# Patient Record
Sex: Female | Born: 1960 | Hispanic: Yes | Marital: Single
Health system: Southern US, Community
[De-identification: ages and names within clinical notes are randomized; demographics above are authoritative.]

---

## 2012-04-06 HISTORY — PX: BREAST BIOPSY: SHX20

## 2016-07-22 ENCOUNTER — Ambulatory Visit
Admission: RE | Admit: 2016-07-22 | Discharge: 2016-07-22 | Disposition: A | Discharge status: Home / Self Care | Payer: Self-pay | Source: Ambulatory Visit | Attending: Oncology | Admitting: Oncology

## 2016-07-22 ENCOUNTER — Ambulatory Visit: Payer: Self-pay | Attending: Oncology

## 2016-07-22 VITALS — BP 154/97 | HR 57 | Temp 96.7°F | Resp 18 | Ht 61.0 in | Wt 135.5 lb

## 2016-07-22 DIAGNOSIS — Z0000 Encounter for general adult medical examination without abnormal findings: Secondary | ICD-10-CM

## 2016-07-22 NOTE — Progress Notes (Signed)
Subjective:     Patient ID: Monica Ayala, female   DOB: 1960/04/22, 56 y.o.   MRN: [id number]  HPI   Review of Systems     Objective:   Physical Exam  Pulmonary/Chest: Right breast exhibits no inverted nipple, no mass, no nipple discharge, no skin change and no tenderness. Left breast exhibits no inverted nipple, no mass, no nipple discharge, no skin change and no tenderness. Breasts are symmetrical.       Assessment:     56 year old patient presents for [facility] clinic visit.  Patient screened, and meets B[facility]CP eligibility.  Patient does not have insurance, Medicare or Medicaid.  Handout given on Affordable Care Act.  Instructed patient on breast self-exam using teach back method.  CBE unremarkable.  Patient states she had an emergency hysterectomy at [facility] in 2007 when her child was born.  Pelvic exam reveals no cervix.  Jaqui Laukaitis interpreted exam.    Plan:     Instructed patient on breast self-exam using teach back method.

## 2016-07-28 ENCOUNTER — Inpatient Hospital Stay
Admission: RE | Admit: 2016-07-28 | Discharge: 2016-07-28 | Disposition: A | Discharge status: Home / Self Care | Payer: Self-pay | Source: Ambulatory Visit | Attending: *Deleted | Admitting: *Deleted

## 2016-07-28 ENCOUNTER — Other Ambulatory Visit: Payer: Self-pay | Admitting: *Deleted

## 2016-07-28 DIAGNOSIS — Z9289 Personal history of other medical treatment: Secondary | ICD-10-CM

## 2016-07-29 NOTE — Progress Notes (Signed)
Letter mailed from [facility] to notify of normal mammogram results.  Patient to return in one year for annual screening.  Copy to [facility]. 

## 2017-09-01 ENCOUNTER — Ambulatory Visit: Payer: Self-pay | Attending: Oncology | Admitting: *Deleted

## 2017-09-01 ENCOUNTER — Ambulatory Visit
Admission: RE | Admit: 2017-09-01 | Discharge: 2017-09-01 | Disposition: A | Discharge status: Home / Self Care | Payer: Self-pay | Source: Ambulatory Visit | Attending: Oncology | Admitting: Oncology

## 2017-09-01 VITALS — BP 142/84 | HR 59 | Temp 98.1°F

## 2017-09-01 DIAGNOSIS — Z0000 Encounter for general adult medical examination without abnormal findings: Secondary | ICD-10-CM

## 2017-09-01 NOTE — Progress Notes (Signed)
  Subjective:     Patient ID: Monica Ayala, female   DOB: April 30, 1960, 57 y.o.   MRN: [id number]  HPI   Review of Systems     Objective:   Physical Exam  Pulmonary/Chest: Right breast exhibits no inverted nipple, no mass, no nipple discharge, no skin change and no tenderness. Left breast exhibits no inverted nipple, no mass, no nipple discharge, no skin change and no tenderness.       Assessment:     57 year old Hispanic female returns to [facility] for annual screening.  Orson Slick, the interpreter present during the interview and exam.  Clinical breast exam unremarkable.  Taught self breast awareness. Patient has been screened for eligibility.  She does not have any insurance, Medicare or Medicaid.  She also meets financial eligibility.  Hand-out given on the Affordable Care Act.    Plan:     Screening mammogram ordered.  Will follow-up per [facility] protocol.

## 2017-09-01 NOTE — Patient Instructions (Signed)
Gave patient hand-out, Women Staying Healthy, Active and Well from [facility], with education on breast health, pap smears, heart and colon health. 

## 2017-10-01 ENCOUNTER — Encounter: Payer: Self-pay | Admitting: *Deleted

## 2017-10-01 NOTE — Progress Notes (Signed)
Letter mailed from [facility] to inform patient of her normal mammogram results.  Patient is to follow-up with annual screening in one year.  [facility] to [facility]y. 

## 2019-06-22 IMAGING — MG MM DIGITAL SCREENING BILAT W/ TOMO W/ CAD
8 series · 8 of 24 positions shown · non-contrast
Comparison: Previous exam(s).

CLINICAL DATA: Screening.

EXAM:
DIGITAL SCREENING BILATERAL MAMMOGRAM WITH TOMO AND CAD

[L CC synth-2D]
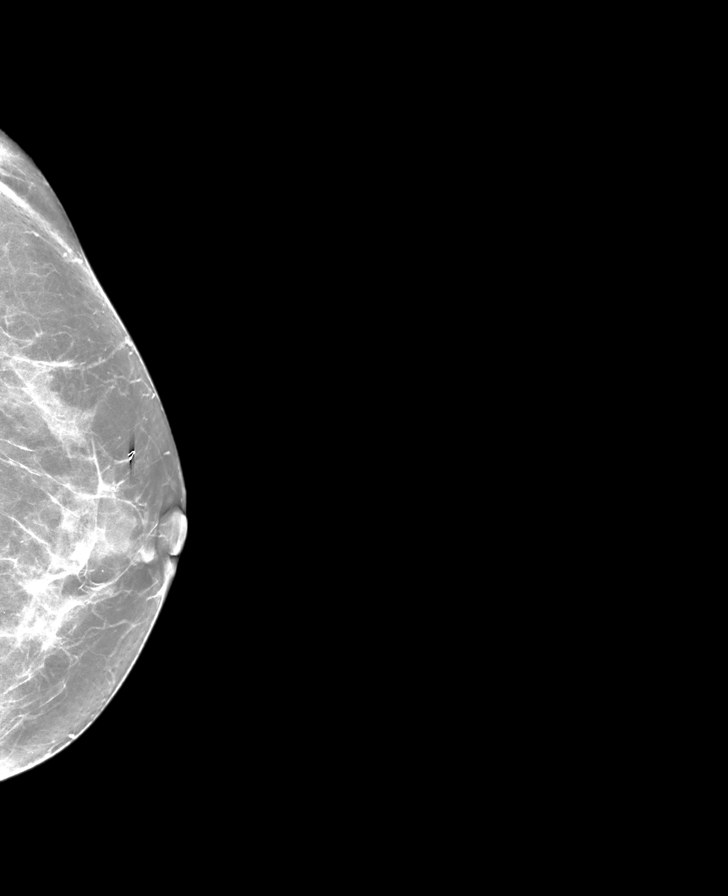

[L MLO synth-2D]
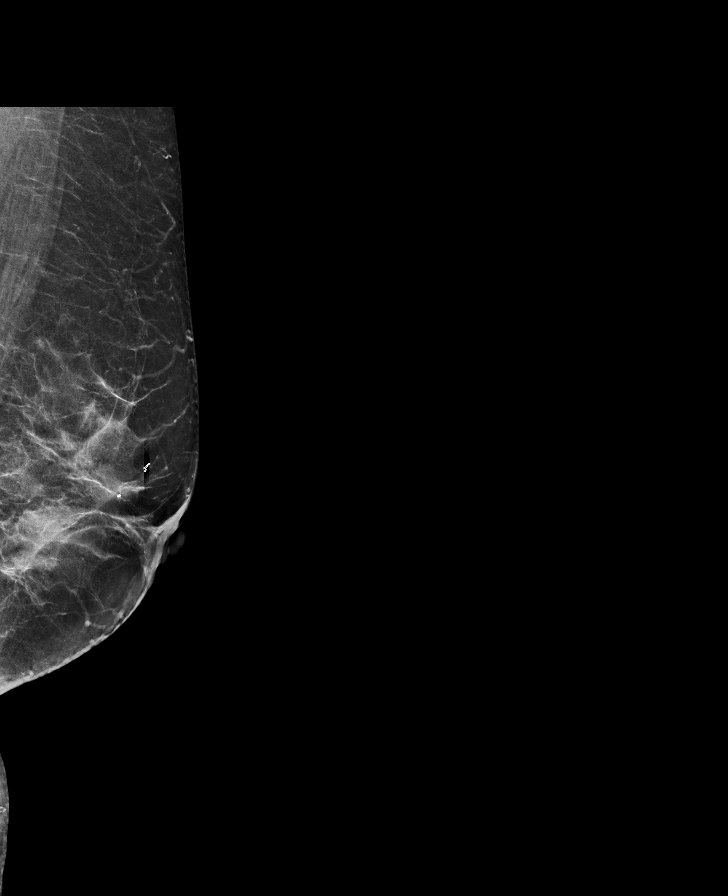

[R MLO synth-2D]
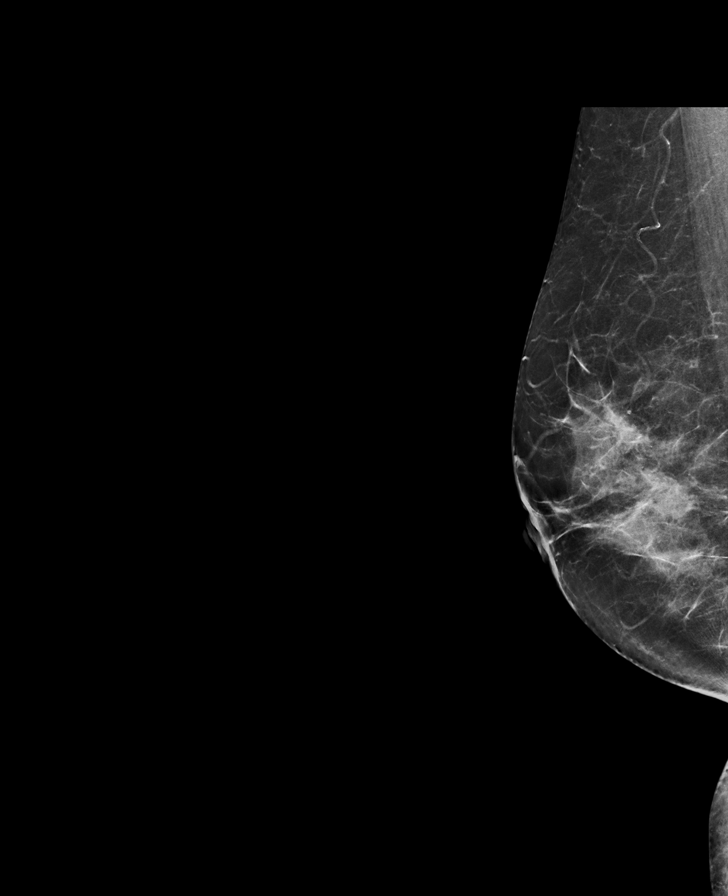

[R CC synth-2D]
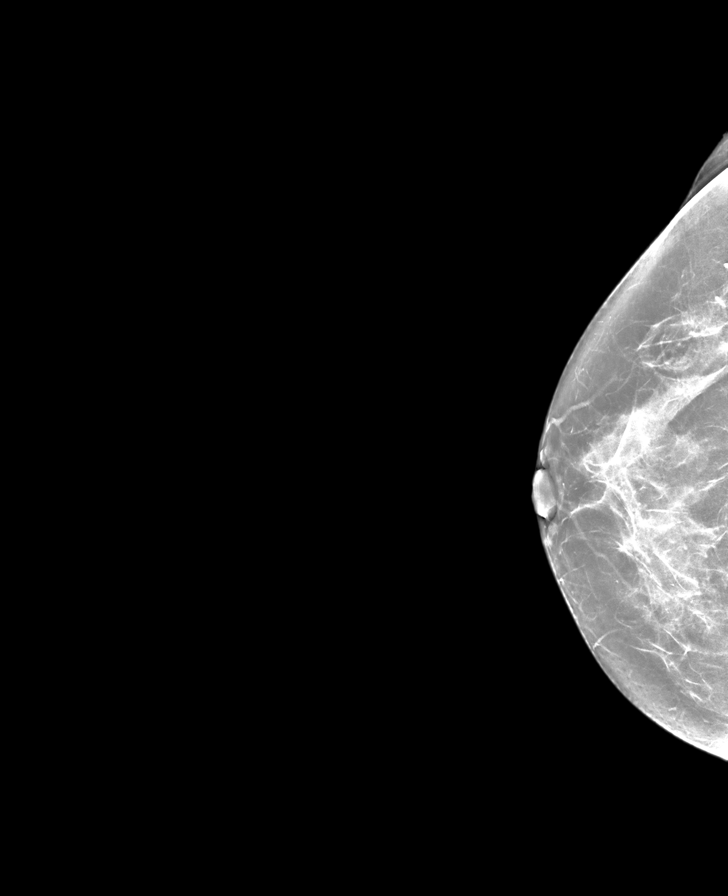

[R MLO tomo · tomo slice 33/65.0]
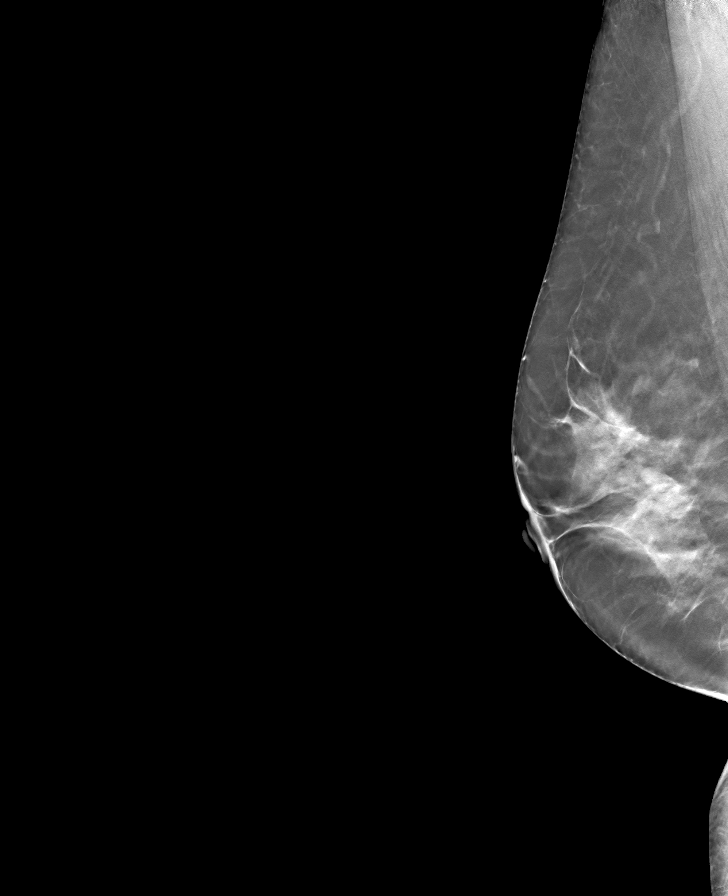

[R CC tomo · tomo slice 33/66.0]
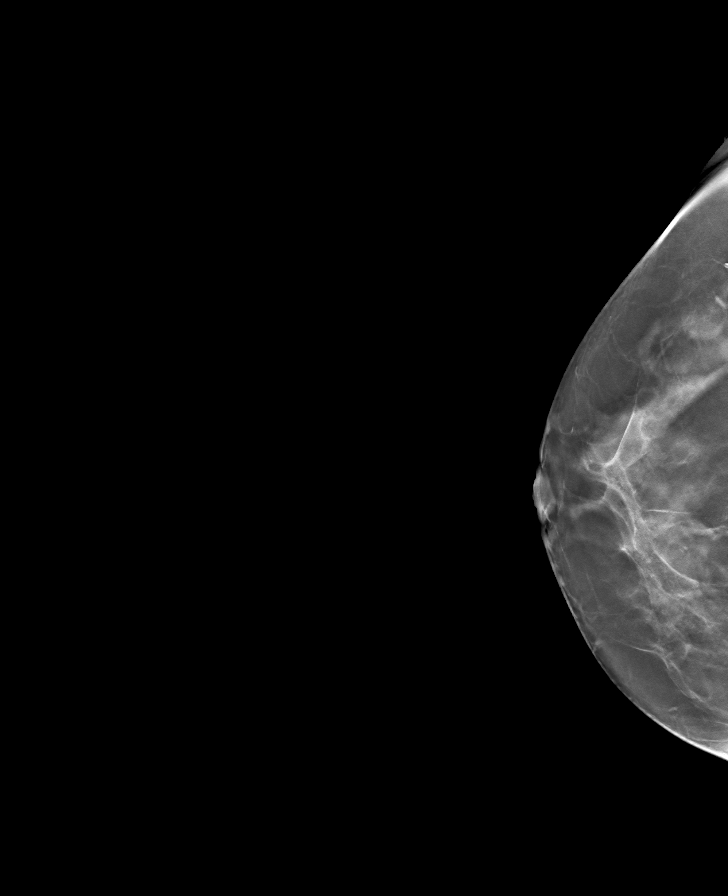

[L MLO tomo · tomo slice 33/65.0]
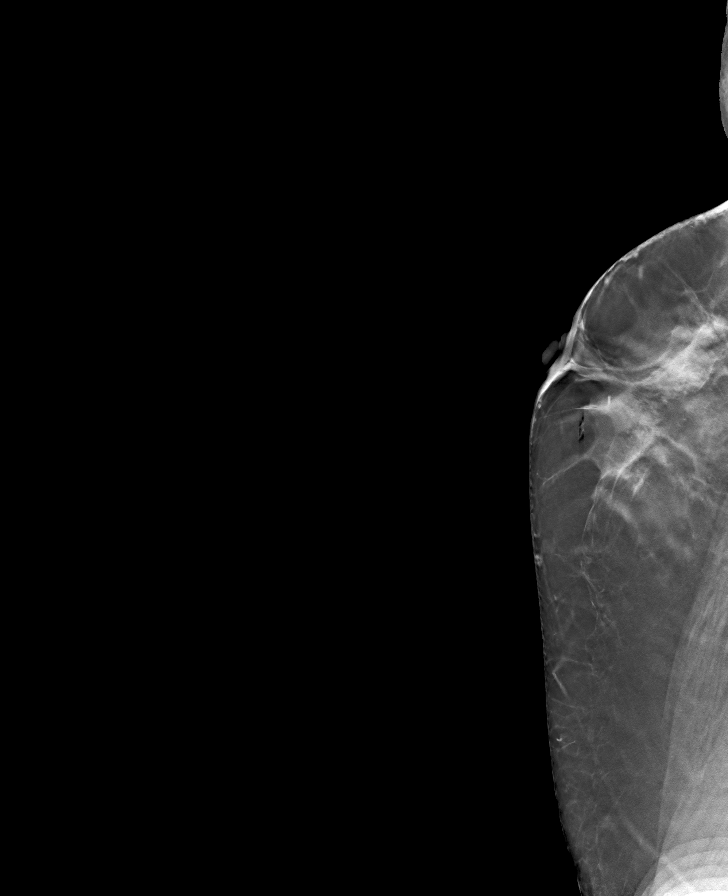

[L CC tomo · tomo slice 33/64.0]
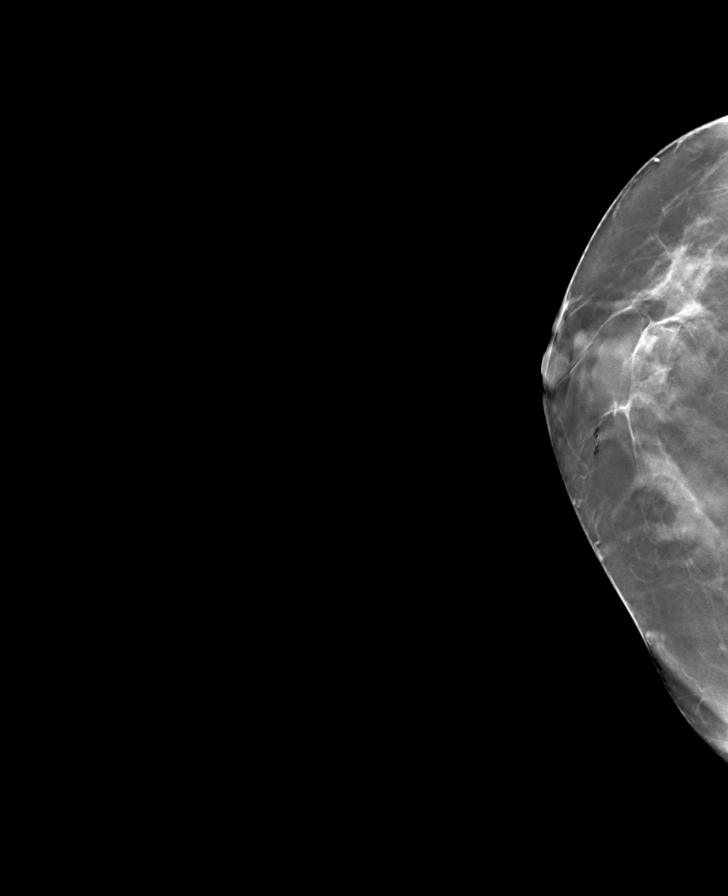

[8 of 24 positions shown; findings below may reference images not displayed]

ACR Breast Density Category c: The breast tissue is heterogeneously
dense, which may obscure small masses.
FINDINGS: There are no findings suspicious for malignancy. Images were
processed with CAD.
IMPRESSION: No mammographic evidence of malignancy. A result letter of this
screening mammogram will be mailed directly to the patient.

RECOMMENDATION:
Screening mammogram in one year. (Code:FT-U-LHB)

BI-RADS CATEGORY  1: Negative.

## 2021-03-18 ENCOUNTER — Ambulatory Visit
Admission: RE | Admit: 2021-03-18 | Discharge: 2021-03-18 | Disposition: A | Discharge status: Home / Self Care | Payer: Self-pay | Source: Ambulatory Visit | Attending: Oncology | Admitting: Oncology

## 2021-03-18 ENCOUNTER — Encounter (INDEPENDENT_AMBULATORY_CARE_PROVIDER_SITE_OTHER): Payer: Self-pay

## 2021-03-18 ENCOUNTER — Other Ambulatory Visit: Payer: Self-pay

## 2021-03-18 ENCOUNTER — Ambulatory Visit: Payer: Self-pay | Attending: Oncology | Admitting: *Deleted

## 2021-03-18 ENCOUNTER — Encounter: Payer: Self-pay | Admitting: *Deleted

## 2021-03-18 VITALS — BP 148/73 | HR 66 | Temp 97.7°F | Ht 60.0 in | Wt 133.7 lb

## 2021-03-18 DIAGNOSIS — Z0000 Encounter for general adult medical examination without abnormal findings: Secondary | ICD-10-CM

## 2021-03-18 NOTE — Progress Notes (Signed)
°  Subjective:     Patient ID: Monica Ayala, female   DOB: [birth year], 60 y.o.   MRN: [id number]  HPI  [facility] Medical History Record - 03/18/21 1109       Breast History   Screening cycle New    Provider (CBE) [place]    Initial Mammogram 03/18/21    Last Mammogram Annual    Last Mammogram Date 09/01/17    Provider (Mammogram)  Delford Field    Recent Breast Symptoms None      Breast Cancer History   Breast Cancer History No personal or family history      Previous History of Breast Problems   Breast Surgery or Biopsy Left   08/08/10 [facility] Fibroadenoma   Breast Implants N/A    BSE Done Monthly      Gynecological/Obstetrical History   LMP 09/01/05    Is there any chance that the client could be pregnant?  No    Age at menarche 4    Age at menopause 2007    Date of last PAP  --   2007 with bleeding with pregnancy   Provider (PAP) [facility]    Age at first live birth 60    Breast fed children Yes (type length in comments)   1 year   DES Exposure No    Cervical, Uterine or Ovarian cancer No    Family history of Cervial, Uterine or Ovarian cancer No    Hysterectomy Yes    Cervix removed Yes    Ovaries removed Yes    Laser/Cryosurgery No    Current method of birth control None    Current method of Estrogen/Hormone replacement None    Smoking history None              Review of Systems     Objective:   Physical Exam Chest:  Breasts:    Right: No swelling, bleeding, inverted nipple, mass, nipple discharge, skin change or tenderness.     Left: No swelling, bleeding, inverted nipple, mass, nipple discharge, skin change or tenderness.  Lymphadenopathy:     Upper Body:     Right upper body: No supraclavicular or axillary adenopathy.     Left upper body: No supraclavicular or axillary adenopathy.      Assessment:     60 year old Hispanic female returns to [facility] for annual screening.  Monica Ayala, the interpreter present during the interview and exam.  Clinical breast  exam is unremarkable.  Taught self breast awareness.  Patient with a history of hysterectomy.  Pap omitted per protocol.  Patient has been screened for eligibility.  She does not have any insurance, Medicare or Medicaid.  She also meets financial eligibility.      Plan:     Screening mammogram ordered.  Will follow up per B[facility]CP protocol.

## 2021-03-23 NOTE — Progress Notes (Signed)
Letter mailed from [facility] to notify of normal mammogram results.  Patient to return in one year for annual screening.  Copy to [facility]. 

## 2021-03-24 ENCOUNTER — Encounter: Payer: Self-pay | Admitting: *Deleted

## 2021-03-24 NOTE — Progress Notes (Signed)
Letter mailed from [facility] to inform patient of her normal mammogram results.  Patient is to follow-up with annual screening in one year. 

## 2023-09-30 ENCOUNTER — Telehealth: Payer: Self-pay | Admitting: *Deleted
